# Patient Record
Sex: Female | Born: 1975 | Race: White | Hispanic: No | Marital: Single | Smoking: Current every day smoker
Health system: Southern US, Community
[De-identification: ages and names within clinical notes are randomized; demographics above are authoritative.]

---

## 2000-09-03 ENCOUNTER — Emergency Department (HOSPITAL_COMMUNITY): Admission: EM | Admit: 2000-09-03 | Discharge: 2000-09-03 | Payer: Self-pay | Admitting: Emergency Medicine

## 2006-09-24 ENCOUNTER — Emergency Department (HOSPITAL_COMMUNITY): Admission: EM | Admit: 2006-09-24 | Discharge: 2006-09-24 | Payer: Self-pay | Admitting: *Deleted

## 2007-12-27 ENCOUNTER — Other Ambulatory Visit: Payer: Self-pay | Admitting: Emergency Medicine

## 2007-12-28 ENCOUNTER — Ambulatory Visit: Payer: Self-pay | Admitting: Physician Assistant

## 2007-12-28 ENCOUNTER — Inpatient Hospital Stay (HOSPITAL_COMMUNITY): Admission: AD | Admit: 2007-12-28 | Discharge: 2007-12-28 | Payer: Self-pay | Admitting: Obstetrics and Gynecology

## 2011-05-10 LAB — DIFFERENTIAL
Basophils Absolute: 0
Basophils Relative: 0
Eosinophils Absolute: 0.2
Monocytes Relative: 8
Neutrophils Relative %: 73

## 2011-05-10 LAB — CBC
MCHC: 33.4
MCV: 88.9
RBC: 3.99
RDW: 14.1

## 2011-05-10 LAB — BASIC METABOLIC PANEL
BUN: 6
CO2: 22
Chloride: 107
Creatinine, Ser: 0.61
GFR calc Af Amer: >60
Glucose, Bld: 113 — ABNORMAL HIGH

## 2011-05-10 LAB — URINALYSIS, ROUTINE W REFLEX MICROSCOPIC
Bilirubin Urine: NEGATIVE
Hgb urine dipstick: NEGATIVE
Ketones, ur: NEGATIVE
Nitrite: NEGATIVE
Protein, ur: NEGATIVE
Urobilinogen, UA: 0.2

## 2011-10-19 ENCOUNTER — Encounter (HOSPITAL_COMMUNITY): Payer: Self-pay | Admitting: Emergency Medicine

## 2011-10-19 ENCOUNTER — Emergency Department (HOSPITAL_COMMUNITY)
Admission: EM | Admit: 2011-10-19 | Discharge: 2011-10-20 | Disposition: A | Discharge status: Home Health | Payer: Self-pay | Attending: Emergency Medicine | Admitting: Emergency Medicine

## 2011-10-19 ENCOUNTER — Emergency Department (HOSPITAL_COMMUNITY): Payer: Self-pay

## 2011-10-19 DIAGNOSIS — M25473 Effusion, unspecified ankle: Secondary | ICD-10-CM | POA: Insufficient documentation

## 2011-10-19 DIAGNOSIS — S93409A Sprain of unspecified ligament of unspecified ankle, initial encounter: Secondary | ICD-10-CM | POA: Insufficient documentation

## 2011-10-19 DIAGNOSIS — IMO0002 Reserved for concepts with insufficient information to code with codable children: Secondary | ICD-10-CM | POA: Insufficient documentation

## 2011-10-19 DIAGNOSIS — M25469 Effusion, unspecified knee: Secondary | ICD-10-CM | POA: Insufficient documentation

## 2011-10-19 DIAGNOSIS — S80211A Abrasion, right knee, initial encounter: Secondary | ICD-10-CM

## 2011-10-19 DIAGNOSIS — M25476 Effusion, unspecified foot: Secondary | ICD-10-CM | POA: Insufficient documentation

## 2011-10-19 DIAGNOSIS — S93402A Sprain of unspecified ligament of left ankle, initial encounter: Secondary | ICD-10-CM

## 2011-10-19 DIAGNOSIS — W108XXA Fall (on) (from) other stairs and steps, initial encounter: Secondary | ICD-10-CM | POA: Insufficient documentation

## 2011-10-19 DIAGNOSIS — M25579 Pain in unspecified ankle and joints of unspecified foot: Secondary | ICD-10-CM | POA: Insufficient documentation

## 2011-10-19 DIAGNOSIS — M25569 Pain in unspecified knee: Secondary | ICD-10-CM | POA: Insufficient documentation

## 2011-10-19 NOTE — ED Notes (Signed)
PT. FELL FROM A PORCH THIS EVENING , PRESENTS WITH PAIN AT RIGHT KNEE AND LEFT ANKLE WORSE WITH MOVEMENT.

## 2011-10-20 MED ORDER — HYDROCODONE-ACETAMINOPHEN 5-325 MG PO TABS
1.0000 | ORAL_TABLET | Freq: Once | ORAL | Status: AC
  Administered 2011-10-20: 1 via ORAL
  Filled 2011-10-20: qty 1

## 2011-10-20 MED ORDER — HYDROCODONE-ACETAMINOPHEN 5-325 MG PO TABS: 1.0000 | ORAL_TABLET | ORAL | Status: AC | PRN

## 2011-10-20 MED ORDER — IBUPROFEN 800 MG PO TABS
800.0000 mg | ORAL_TABLET | Freq: Once | ORAL | Status: AC
  Administered 2011-10-20: 800 mg via ORAL
  Filled 2011-10-20: qty 1

## 2011-10-20 MED ORDER — IBUPROFEN 400 MG PO TABS: 400.0000 mg | ORAL_TABLET | Freq: Four times a day (QID) | ORAL | Status: AC | PRN

## 2011-10-20 NOTE — Discharge Instructions (Signed)
Please review the instructions below. The x-ray of your right knee shows no acute injury. Findings on your left ankle x-ray are consistent with a sprain of your left ankle. Wear the ASO splint and crutches until your discomfort improves. Read over the RICE instructions. Use the ibuprofen 3 times a day for 3 days as instructed and the narcotic pain medicine for more severe pain. If your pain does not begin to resolve over the next 3-5 days we have provided a referral to an orthopedic physician with whom he can arrange an follow up for further evaluation.   Abrasions Abrasions are skin scrapes. Their treatment depends on how large and deep the abrasion is. Abrasions do not extend through all layers of the skin. A cut or lesion through all skin layers is called a laceration. HOME CARE INSTRUCTIONS   If you were given a dressing, change it at least once a day or as instructed by your caregiver. If the bandage sticks, soak it off with a solution of water or hydrogen peroxide.   Twice a day, wash the area with soap and water to remove all the cream/ointment. You may do this in a sink, under a tub faucet, or in a shower. Rinse off the soap and pat dry with a clean towel. Look for signs of infection (see below).   Reapply cream/ointment according to your caregiver's instruction. This will help prevent infection and keep the bandage from sticking. Telfa or gauze over the wound and under the dressing or wrap will also help keep the bandage from sticking.   If the bandage becomes wet, dirty, or develops a foul smell, change it as soon as possible.   Only take over-the-counter or prescription medicines for pain, discomfort, or fever as directed by your caregiver.  SEEK IMMEDIATE MEDICAL CARE IF:   Increasing pain in the wound.   Signs of infection develop: redness, swelling, surrounding area is tender to touch, or pus coming from the wound.   You have a fever.   Any foul smell coming from the wound or  dressing.  Most skin wounds heal within ten days. Facial wounds heal faster. However, an infection may occur despite proper treatment. You should have the wound checked for signs of infection within 24 to 48 hours or sooner if problems arise. If you were not given a wound-check appointment, look closely at the wound yourself on the second day for early signs of infection listed above. MAKE SURE YOU:   Understand these instructions.   Will watch your condition.   Will get help right away if you are not doing well or get worse.  Document Released: 05/10/2005 Document Revised: 07/20/2011 Document Reviewed: 07/04/2011 [facility] Patient Information 2012 [place], [place].Ankle Sprain An ankle sprain is an injury to the strong, fibrous tissues (ligaments) that hold the bones of your ankle joint together.  CAUSES Ankle sprain usually is caused by a fall or by twisting your ankle. People who participate in sports are more prone to these types of injuries.  SYMPTOMS  Symptoms of ankle sprain include:  Pain in your ankle. The pain may be present at rest or only when you are trying to stand or walk.   Swelling.   Bruising. Bruising may develop immediately or within 1 to 2 days after your injury.   Difficulty standing or walking.  DIAGNOSIS  Your caregiver will ask you details about your injury and perform a physical exam of your ankle to determine if you have an ankle sprain.  During the physical exam, your caregiver will press and squeeze specific areas of your foot and ankle. Your caregiver will try to move your ankle in certain ways. An X-ray exam may be done to be sure a bone was not broken or a ligament did not separate from one of the bones in your ankle (avulsion).  TREATMENT  Certain types of braces can help stabilize your ankle. Your caregiver can make a recommendation for this. Your caregiver may recommend the use of medication for pain. If your sprain is severe, your caregiver may refer you to  a surgeon who helps to restore function to parts of your skeletal system (orthopedist) or a physical therapist. HOME CARE INSTRUCTIONS  Apply ice to your injury for 1 to 2 days or as directed by your caregiver. Applying ice helps to reduce inflammation and pain.  Put ice in a plastic bag.   Place a towel between your skin and the bag.   Leave the ice on for 15 to 20 minutes at a time, every 2 hours while you are awake.   Take over-the-counter or prescription medicines for pain, discomfort, or fever only as directed by your caregiver.   Keep your injured leg elevated, when possible, to lessen swelling.   If your caregiver recommends crutches, use them as instructed. Gradually, put weight on the affected ankle. Continue to use crutches or a cane until you can walk without feeling pain in your ankle.   If you have a plaster splint, wear the splint as directed by your caregiver. Do not rest it on anything harder than a pillow the first 24 hours. Do not put weight on it. Do not get it wet. You may take it off to take a shower or bath.   You may have been given an elastic bandage to wear around your ankle to provide support. If the elastic bandage is too tight (you have numbness or tingling in your foot or your foot becomes cold and blue), adjust the bandage to make it comfortable.   If you have an air splint, you may blow more air into it or let air out to make it more comfortable. You may take your splint off at night and before taking a shower or bath.   Wiggle your toes in the splint several times per day if you are able.  SEEK MEDICAL CARE IF:   You have an increase in bruising, swelling, or pain.   Your toes feel cold.   Pain relief is not achieved with medication.  SEEK IMMEDIATE MEDICAL CARE IF: Your toes are numb or blue or you have severe pain. MAKE SURE YOU:   Understand these instructions.   Will watch your condition.   Will get help right away if you are not doing well or  get worse.  Document Released: 07/31/2005 Document Revised: 07/20/2011 Document Reviewed: 03/04/2008 [facility] Patient Information 2012 [place], [place].Ankle Sprain An ankle sprain is an injury to the strong, fibrous tissues (ligaments) that hold the bones of your ankle joint together.  CAUSES Ankle sprain usually is caused by a fall or by twisting your ankle. People who participate in sports are more prone to these types of injuries.  SYMPTOMS  Symptoms of ankle sprain include:  Pain in your ankle. The pain may be present at rest or only when you are trying to stand or walk.   Swelling.   Bruising. Bruising may develop immediately or within 1 to 2 days after your injury.   Difficulty  standing or walking.  DIAGNOSIS  Your caregiver will ask you details about your injury and perform a physical exam of your ankle to determine if you have an ankle sprain. During the physical exam, your caregiver will press and squeeze specific areas of your foot and ankle. Your caregiver will try to move your ankle in certain ways. An X-ray exam may be done to be sure a bone was not broken or a ligament did not separate from one of the bones in your ankle (avulsion).  TREATMENT  Certain types of braces can help stabilize your ankle. Your caregiver can make a recommendation for this. Your caregiver may recommend the use of medication for pain. If your sprain is severe, your caregiver may refer you to a surgeon who helps to restore function to parts of your skeletal system (orthopedist) or a physical therapist. HOME CARE INSTRUCTIONS  Apply ice to your injury for 1 to 2 days or as directed by your caregiver. Applying ice helps to reduce inflammation and pain.  Put ice in a plastic bag.   Place a towel between your skin and the bag.   Leave the ice on for 15 to 20 minutes at a time, every 2 hours while you are awake.   Take over-the-counter or prescription medicines for pain, discomfort, or fever only as  directed by your caregiver.   Keep your injured leg elevated, when possible, to lessen swelling.   If your caregiver recommends crutches, use them as instructed. Gradually, put weight on the affected ankle. Continue to use crutches or a cane until you can walk without feeling pain in your ankle.   If you have a plaster splint, wear the splint as directed by your caregiver. Do not rest it on anything harder than a pillow the first 24 hours. Do not put weight on it. Do not get it wet. You may take it off to take a shower or bath.   You may have been given an elastic bandage to wear around your ankle to provide support. If the elastic bandage is too tight (you have numbness or tingling in your foot or your foot becomes cold and blue), adjust the bandage to make it comfortable.   If you have an air splint, you may blow more air into it or let air out to make it more comfortable. You may take your splint off at night and before taking a shower or bath.   Wiggle your toes in the splint several times per day if you are able.  SEEK MEDICAL CARE IF:   You have an increase in bruising, swelling, or pain.   Your toes feel cold.   Pain relief is not achieved with medication.  SEEK IMMEDIATE MEDICAL CARE IF: Your toes are numb or blue or you have severe pain. MAKE SURE YOU:   Understand these instructions.   Will watch your condition.   Will get help right away if you are not doing well or get worse.  Document Released: 07/31/2005 Document Revised: 07/20/2011 Document Reviewed: 03/04/2008 [facility] Patient Information 2012 [place], [place].

## 2011-10-20 NOTE — ED Provider Notes (Signed)
History     CSN: [id number]  Arrival date & time 10/19/11  2256   First MD Initiated Contact with Patient 10/20/11 0113      Chief Complaint  Patient presents with  . Fall    HPI: Patient is a 36 y.o. female presenting with fall. The history is provided by the patient.  Fall The accident occurred 6 to 12 hours ago. The fall occurred while walking. She fell from a height of 3 to 5 ft. There was no blood loss. The pain is at a severity of 9/10. The pain is severe. She was ambulatory at the scene. There was no entrapment after the fall. There was no drug use involved in the accident. There was no alcohol use involved in the accident. Pertinent negatives include no numbness and no loss of consciousness. The symptoms are aggravated by standing. The treatment provided no relief.   patient reports that approximately 6:30 PM this evening she was stepping off her with a low step when she tripped and fell. Has had increasing pain and swelling to her left ankle and pain to her right knee since the time of the fall. Denies other injuries.  Past Medical History  Diagnosis Date  . Asthma     Past Surgical History  Procedure Date  . Cesarean section     No family history on file.  History  Substance Use Topics  . Smoking status: Current Everyday Smoker  . Smokeless tobacco: Not on file  . Alcohol Use: No    OB History    Grav Para Term Preterm Abortions TAB SAB Ect Mult Living                  Review of Systems  Constitutional: Negative.   HENT: Negative.   Eyes: Negative.   Respiratory: Negative.   Cardiovascular: Negative.   Gastrointestinal: Negative.   Genitourinary: Negative.   Musculoskeletal: Negative.   Skin: Negative.   Neurological: Negative.  Negative for loss of consciousness and numbness.  Hematological: Negative.   Psychiatric/Behavioral: Negative.     Allergies  Review of patient's allergies indicates no known allergies.  Home Medications  No current  outpatient prescriptions on file.  BP 111/67  Pulse 94  Temp(Src) 98.6 F (37 C) (Oral)  Resp 18  SpO2 97%  LMP 10/02/2011  Physical Exam  Constitutional: She is oriented to person, place, and time. She appears well-developed and well-nourished.  HENT:  Head: Normocephalic and atraumatic.  Eyes: Conjunctivae are normal.  Cardiovascular: Normal rate.   Pulmonary/Chest: Effort normal.  Abdominal: Soft. Bowel sounds are normal.  Musculoskeletal: Normal range of motion.       Feet:       Moderate swelling to (L) lateral ankle. Small abrasion to (R) knee.  Neurological: She is alert and oriented to person, place, and time.  Skin: Skin is warm and dry. No erythema.  Psychiatric: She has a normal mood and affect.    ED Course  Procedures   Findings and clinical impression discussed with patient. Will place ASO splint and fit for crutches and encourage f/u w/ ortho if pain persist.  Labs Reviewed - No data to display Dg Ankle Complete Left  10/19/2011  *RADIOLOGY REPORT*  Clinical Data: Left ankle pain and swelling, past history of fractures  LEFT ANKLE COMPLETE - 3+ VIEW  Comparison: None  Findings: Soft tissue swelling greatest laterally. Osseous mineralization normal. Ankle mortise intact. Question small plantar calcaneal spur. Obliquity on lateral view. No acute  fracture, dislocation, or bone destruction.  IMPRESSION: No acute osseous abnormalities.  Original Report Authenticated By: Lollie Marrow, M.D.   Dg Knee Complete 4 Views Right  10/19/2011  *RADIOLOGY REPORT*  Clinical Data: Pain and swelling post fall down stairs  RIGHT KNEE - COMPLETE 4+ VIEW  Comparison: None.  Findings: Mild joint space narrowing. Low normal osseous mineralization. No acute fracture, dislocation, or bone destruction. No knee joint effusion.  IMPRESSION: Minimal degenerative changes. No acute abnormalities.  Original Report Authenticated By: Lollie Marrow, M.D.     No diagnosis found.    MDM  HPI/PE  and clinical findings c/e 1. Ankle sprain (L)  2. Abrasion (R) knee        Leanne Chang, NP 10/20/11 0153  Roma Kayser Amorita Vanrossum, NP 10/20/11 1610

## 2011-10-20 NOTE — ED Provider Notes (Signed)
Medical screening examination/treatment/procedure(s) were performed by non-physician practitioner and as supervising physician I was immediately available for consultation/collaboration.  Flint Melter, MD 10/20/11 [phone]

## 2013-07-05 IMAGING — CR DG ANKLE COMPLETE 3+V*L*
3 series · 3 of 3 positions shown · non-contrast
Comparison: None

CLINICAL DATA: Left ankle pain and swelling, past history of
fractures

LEFT ANKLE COMPLETE - 3+ VIEW

[t ankle joint ap left]
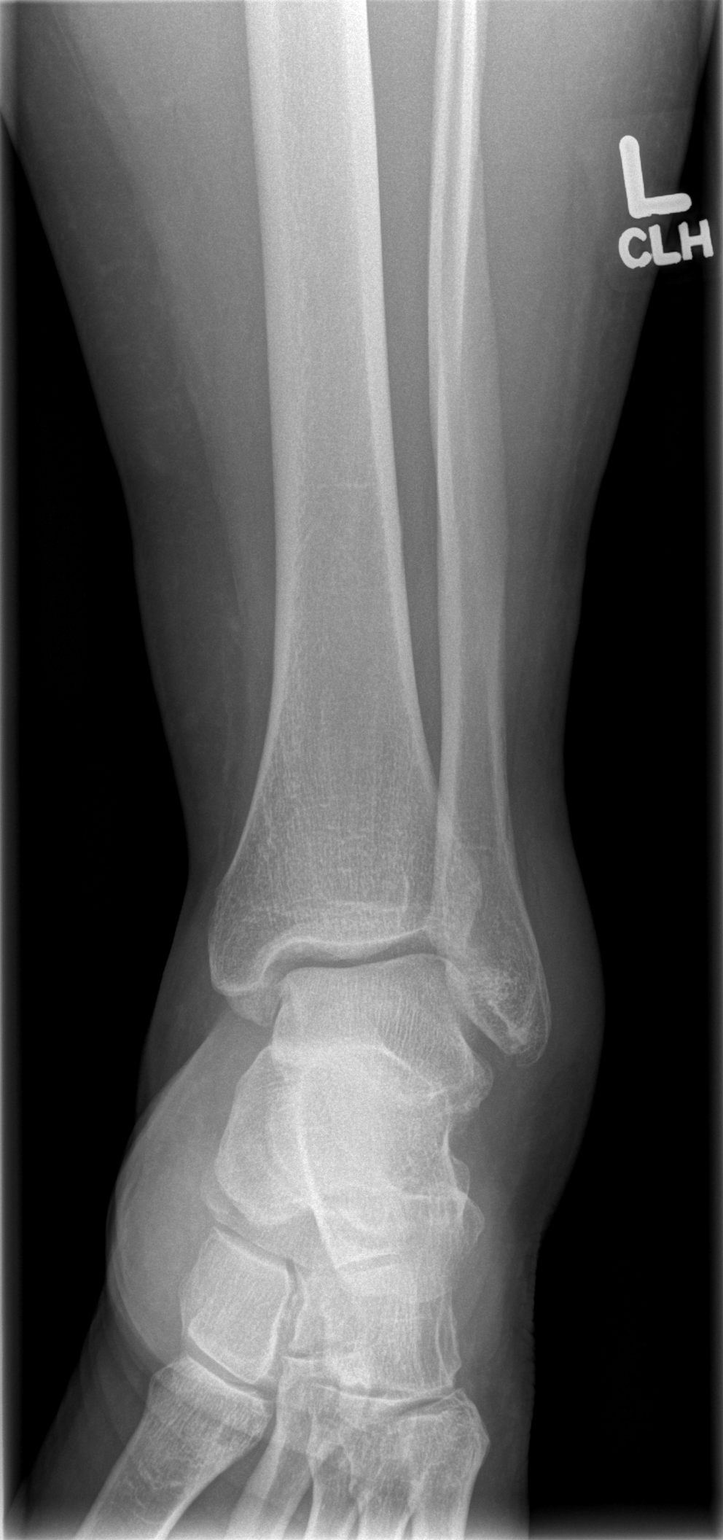

[t ankle joint oblique left]
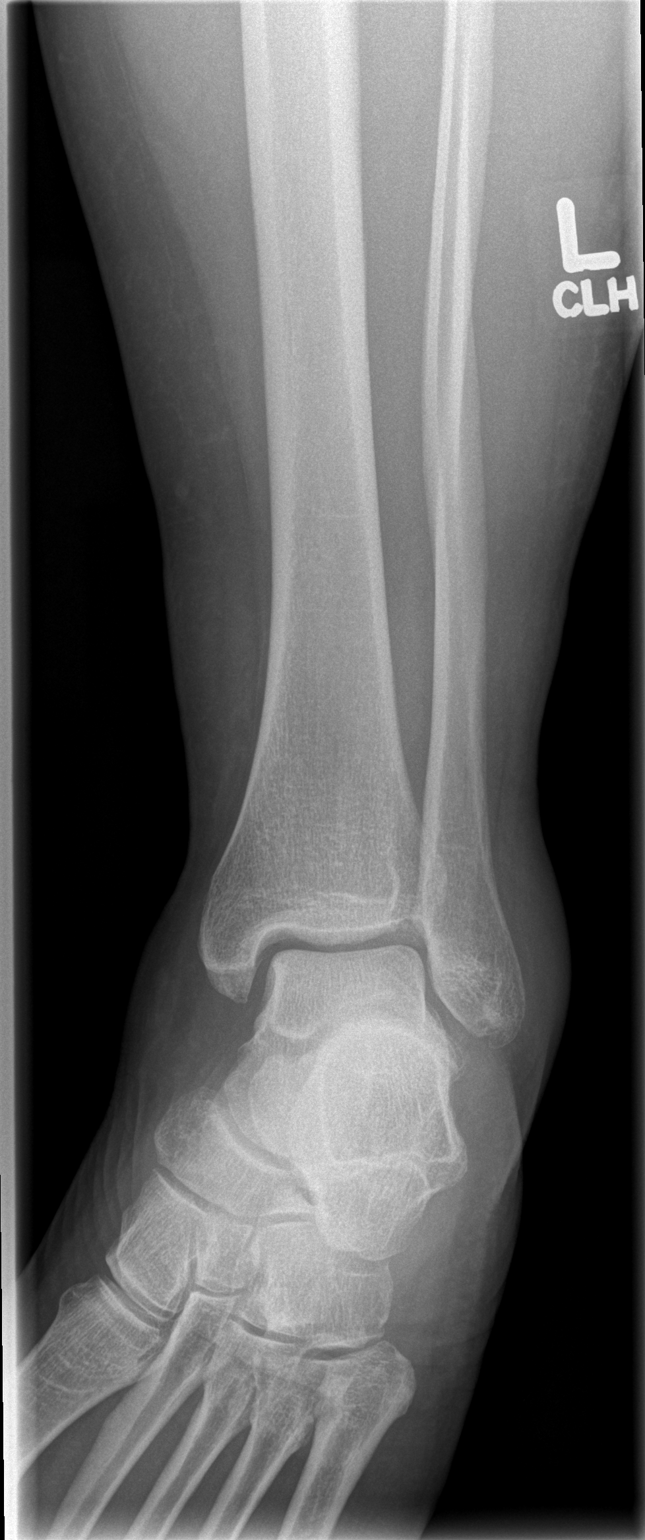

[t ankle joint lat left]
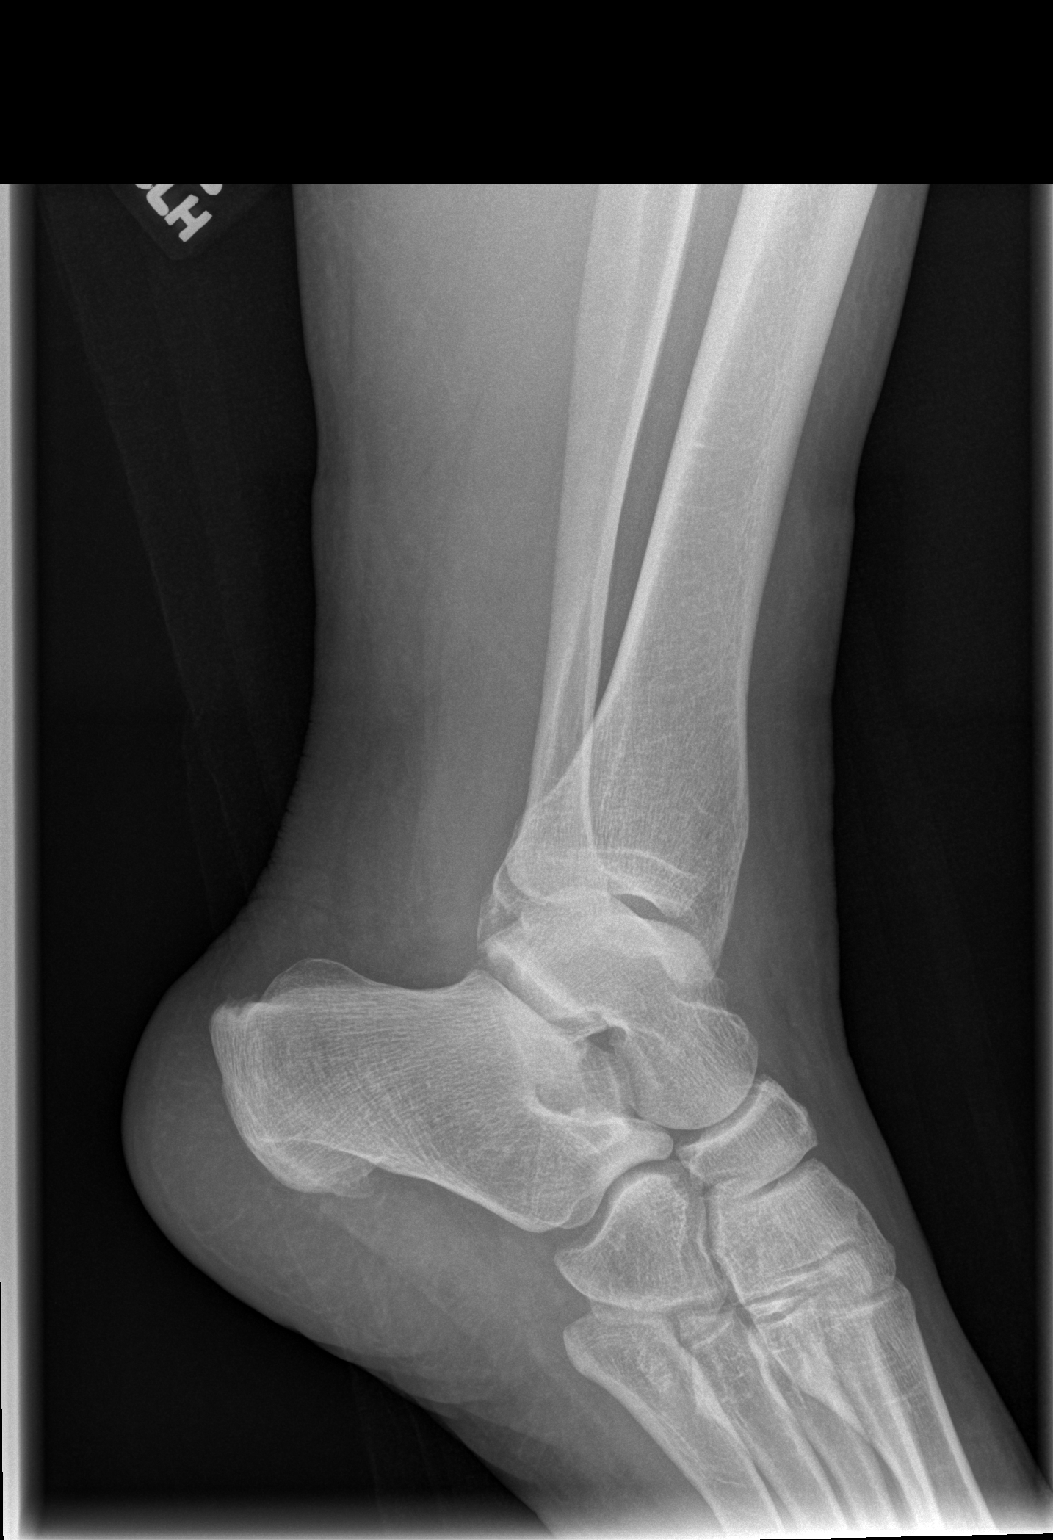

[3 of 3 positions shown; findings below may reference images not displayed]

FINDINGS: Soft tissue swelling greatest laterally.
Osseous mineralization normal.
Ankle mortise intact.
Question small plantar calcaneal spur.
Obliquity on lateral view.
No acute fracture, dislocation, or bone destruction.
IMPRESSION: No acute osseous abnormalities.

## 2013-07-05 IMAGING — CR DG KNEE COMPLETE 4+V*R*
4 series · 4 of 4 positions shown · non-contrast
Comparison: None.

CLINICAL DATA: Pain and swelling post fall down stairs

RIGHT KNEE - COMPLETE 4+ VIEW

[t knee ap right]
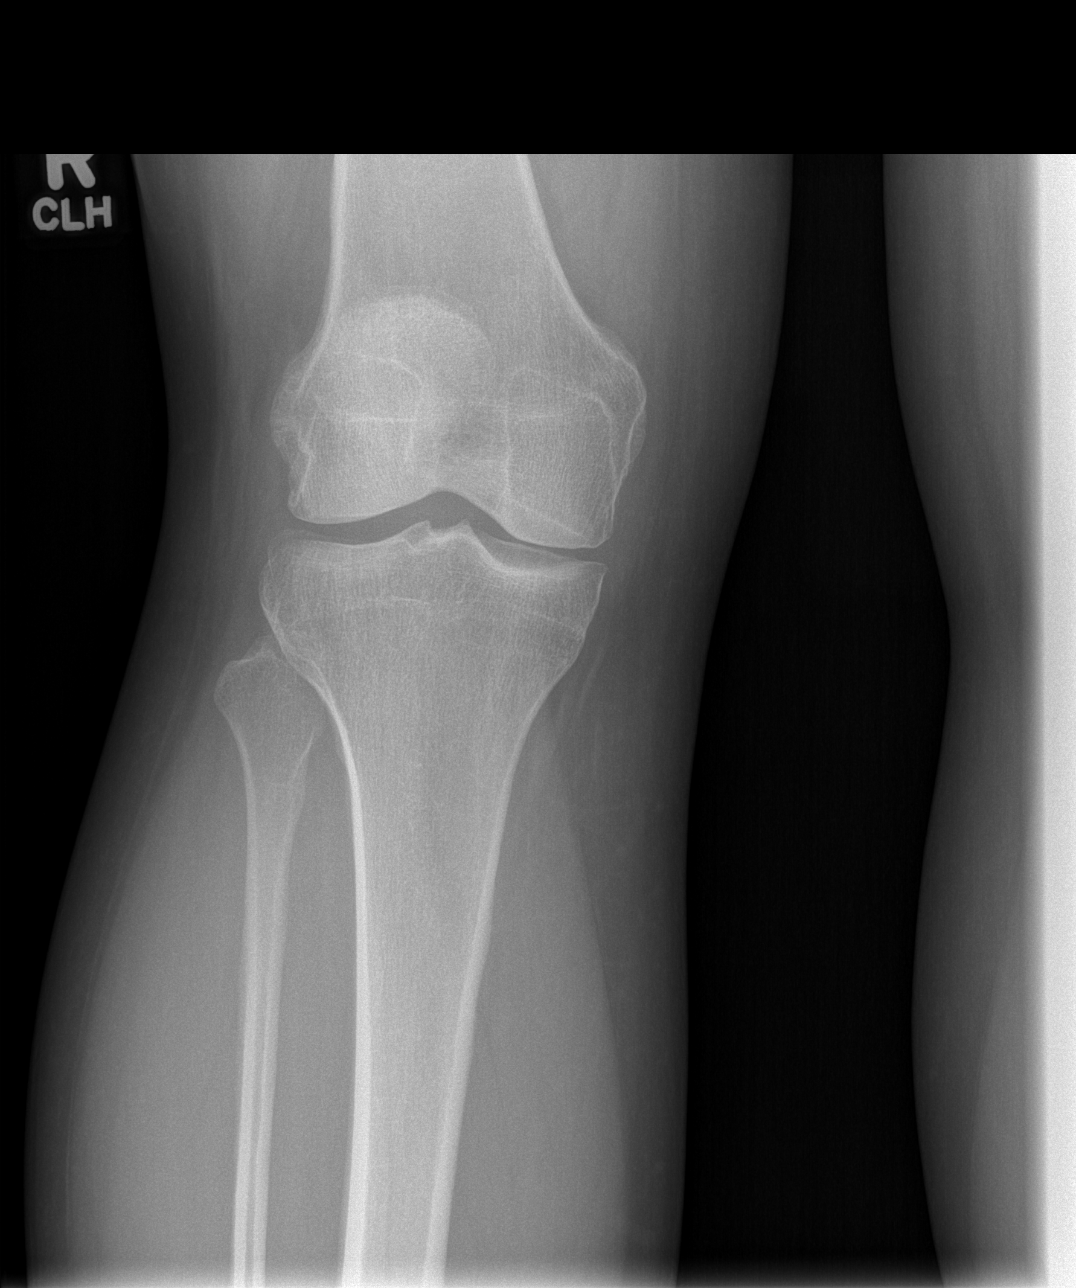

[t knee oblique right (1 of 2)]
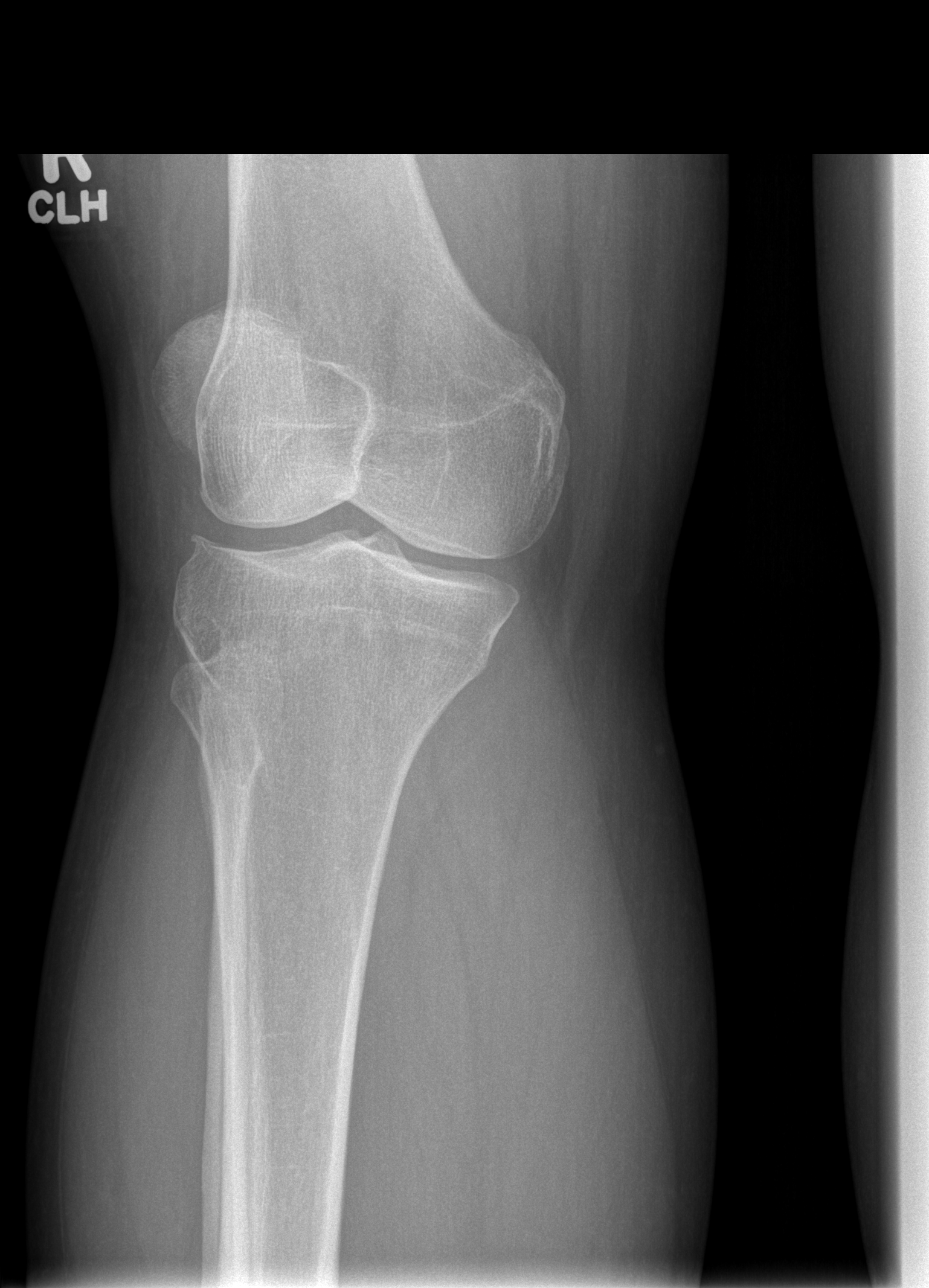

[t knee oblique right (2 of 2)]
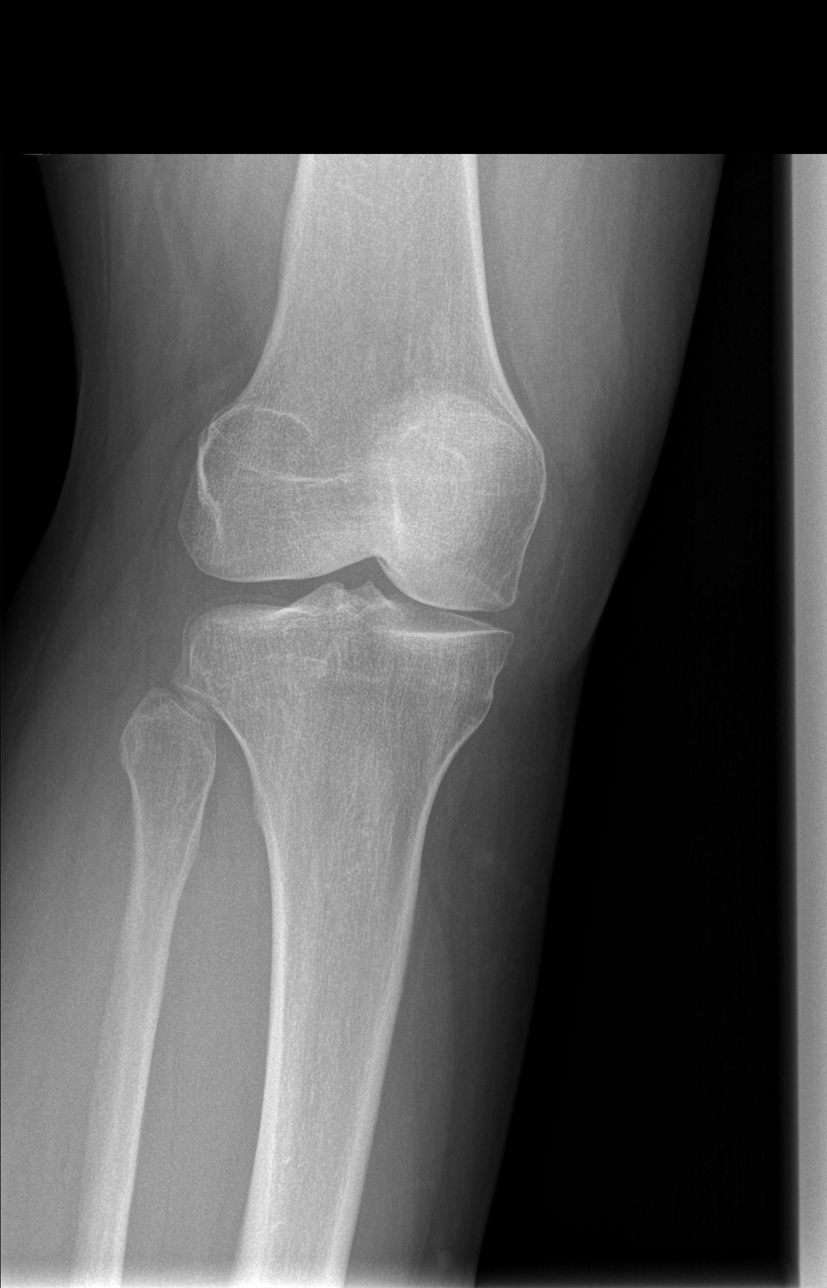

[t knee lat right]
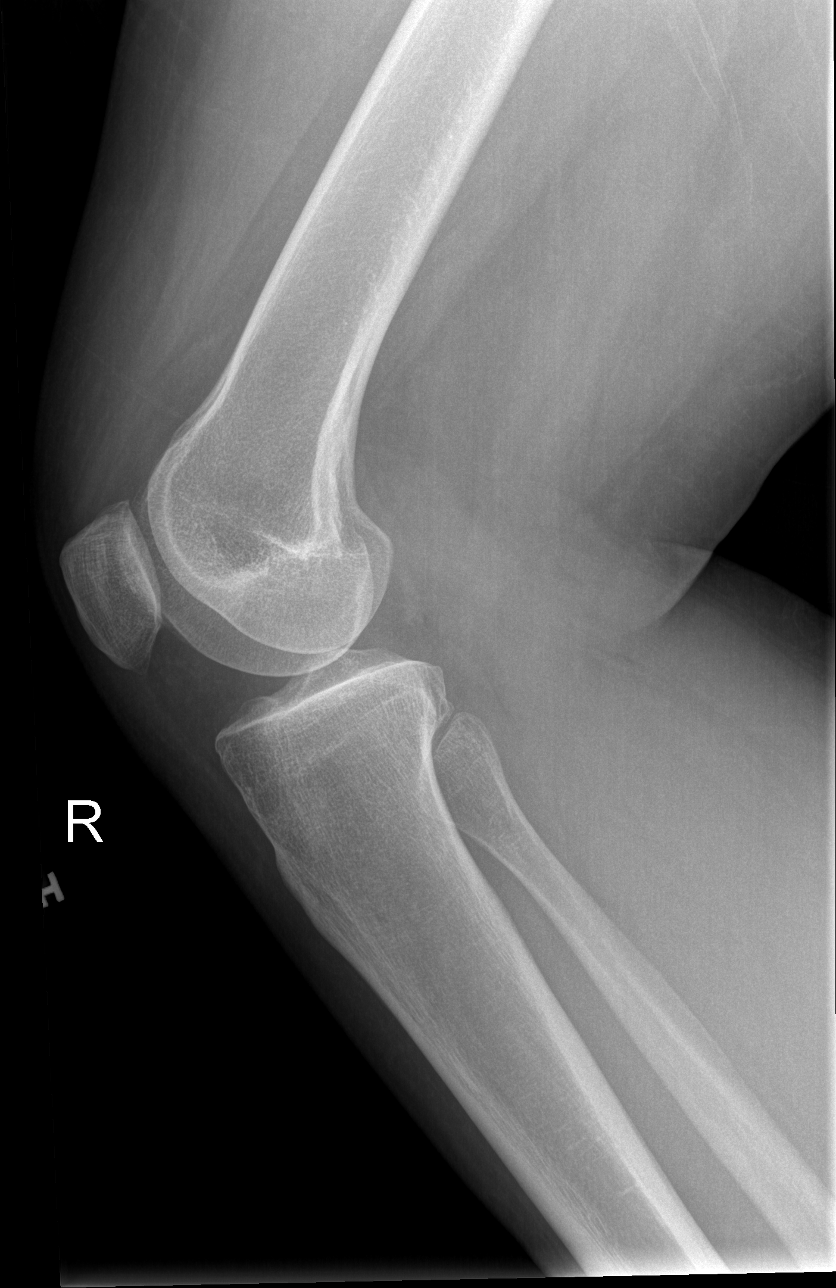

[4 of 4 positions shown; findings below may reference images not displayed]

FINDINGS: Mild joint space narrowing.
Low normal osseous mineralization.
No acute fracture, dislocation, or bone destruction.
No knee joint effusion.
IMPRESSION: Minimal degenerative changes.
No acute abnormalities.

## 2015-10-03 ENCOUNTER — Encounter (HOSPITAL_COMMUNITY): Payer: Self-pay | Admitting: *Deleted

## 2015-10-03 ENCOUNTER — Inpatient Hospital Stay (HOSPITAL_COMMUNITY)
Admission: AD | Admit: 2015-10-03 | Discharge: 2015-10-03 | Disposition: A | Discharge status: Home / Self Care | Payer: Self-pay | Source: Ambulatory Visit | Attending: Obstetrics and Gynecology | Admitting: Obstetrics and Gynecology

## 2015-10-03 DIAGNOSIS — Z3201 Encounter for pregnancy test, result positive: Secondary | ICD-10-CM | POA: Insufficient documentation

## 2015-10-03 DIAGNOSIS — J45909 Unspecified asthma, uncomplicated: Secondary | ICD-10-CM | POA: Insufficient documentation

## 2015-10-03 DIAGNOSIS — O0281 Inappropriate change in quantitative human chorionic gonadotropin (hCG) in early pregnancy: Secondary | ICD-10-CM | POA: Insufficient documentation

## 2015-10-03 DIAGNOSIS — Z3A01 Less than 8 weeks gestation of pregnancy: Secondary | ICD-10-CM | POA: Insufficient documentation

## 2015-10-03 DIAGNOSIS — O3680X0 Pregnancy with inconclusive fetal viability, not applicable or unspecified: Secondary | ICD-10-CM | POA: Insufficient documentation

## 2015-10-03 DIAGNOSIS — F17200 Nicotine dependence, unspecified, uncomplicated: Secondary | ICD-10-CM | POA: Insufficient documentation

## 2015-10-03 LAB — URINALYSIS, ROUTINE W REFLEX MICROSCOPIC
BILIRUBIN URINE: NEGATIVE
Glucose, UA: NEGATIVE mg/dL
HGB URINE DIPSTICK: NEGATIVE
Ketones, ur: NEGATIVE mg/dL
Leukocytes, UA: NEGATIVE
NITRITE: NEGATIVE
PH: 5.5 (ref 5.0–8.0)
Protein, ur: NEGATIVE mg/dL

## 2015-10-03 LAB — POCT PREGNANCY, URINE: PREG TEST UR: POSITIVE — AB

## 2015-10-03 LAB — HCG, QUANTITATIVE, PREGNANCY: hCG, Beta Chain, Quant, S: 205 m[IU]/mL — ABNORMAL HIGH (ref <5)

## 2015-10-03 NOTE — Discharge Instructions (Signed)

## 2015-10-03 NOTE — MAU Note (Signed)
Pt reports she is 6 weeks preg and was seen at [facility] ED last week due to cramping , BHCG was 337. Had some bleeding also at that time and was told it was a threatened miscarriage and was to follow up there today. Has not had spotting since Friday and her abd is sore but not painful.

## 2015-10-03 NOTE — MAU Provider Note (Signed)
History     CSN: [id number]  Arrival date and time: 10/03/15 9147   First Provider Initiated Contact with Patient 10/03/15 2006      Chief Complaint  Patient presents with  . Follow-up   HPI Maria Campos 40 y.o. G3P2000 @ approximately 6 weeks presents to the MAU for repeat Beta HCG. She states that she was seen in the [facility] ER on Thursday because she was having some bleeding and cramping. She was told that her Beta HCG level was 337 and did not have an ultrasound at that time. She was told she might be having a miscarriage and was to come back in 2 days for follow up blood work. She is currently asymptomatic. Denies vaginal bleeding and cramping.   Past Medical History  Diagnosis Date  . Asthma     Past Surgical History  Procedure Laterality Date  . Cesarean section      History reviewed. No pertinent family history.  Social History  Substance Use Topics  . Smoking status: Current Every Day Smoker  . Smokeless tobacco: None  . Alcohol Use: No    Allergies: No Known Allergies  Prescriptions prior to admission  Medication Sig Dispense Refill Last Dose  . diphenhydramine-acetaminophen (TYLENOL PM) 25-500 MG TABS tablet Take 2 tablets by mouth daily as needed (pain).   10/02/2015 at Unknown time    Review of Systems  Constitutional: Negative for fever.  Gastrointestinal: Negative for nausea, vomiting and abdominal pain.  All other systems reviewed and are negative.  Physical Exam   Blood pressure 126/74, pulse 102, temperature 98.9 F (37.2 C), resp. rate 16, height  (1.702 m), weight 133.358 kg (294 lb), last menstrual period 07/27/2015, SpO2 98 %.  Physical Exam  Nursing note and vitals reviewed. Constitutional: She is oriented to person, place, and time. She appears well-developed and well-nourished. No distress.  HENT:  Head: Normocephalic and atraumatic.  Neck: Normal range of motion.  Cardiovascular: Normal rate.   Respiratory: Effort normal.  No respiratory distress.  GI: Soft. She exhibits no distension and no mass. There is no tenderness. There is no rebound and no guarding.  Musculoskeletal: Normal range of motion. She exhibits no edema or tenderness.  Neurological: She is alert and oriented to person, place, and time.  Skin: Skin is warm.  Psychiatric: She has a normal mood and affect. Her behavior is normal. Judgment and thought content normal.   Results for orders placed or performed during the hospital encounter of 10/03/15 (from the past 24 hour(s))  Urinalysis, Routine w reflex microscopic (not at [facility])     Status: Abnormal   Collection Time: 10/03/15  7:40 PM  Result Value Ref Range   Color, Urine YELLOW YELLOW   APPearance CLEAR CLEAR   Specific Gravity, Urine >1.030 (H) 1.005 - 1.030   pH 5.5 5.0 - 8.0   Glucose, UA NEGATIVE NEGATIVE mg/dL   Hgb urine dipstick NEGATIVE NEGATIVE   Bilirubin Urine NEGATIVE NEGATIVE   Ketones, ur NEGATIVE NEGATIVE mg/dL   Protein, ur NEGATIVE NEGATIVE mg/dL   Nitrite NEGATIVE NEGATIVE   Leukocytes, UA NEGATIVE NEGATIVE  Pregnancy, urine POC     Status: Abnormal   Collection Time: 10/03/15  8:14 PM  Result Value Ref Range   Preg Test, Ur POSITIVE (A) NEGATIVE  hCG, quantitative, pregnancy     Status: Abnormal   Collection Time: 10/03/15  8:16 PM  Result Value Ref Range   hCG, Beta Chain, Quant, S 205 (H) <5 mIU/mL  MAU Course  Procedures  MDM Discussed POC with Dr Emelda Fear. Will obtain beta hcg and follow up appropriately. Turned over care to [name], CNM  Assessment and Plan   1. Inappropriate change in quantitative hCG in early pregnancy   2. Pregnancy of unknown anatomic location    DC home Comfort measures reviewed  1st Trimester precautions  Bleeding precautions Ectopic precautions RX: none  Return to MAU as needed   Follow-up Information    Follow up with [facility]   Specialty:  Obstetrics and Gynecology   Why:  [facility] 10/06/15  AT 11:00    Contact information:   [address] [place] [address] [phone]        Clemmons,Lori Grissett 10/03/2015, 8:16 PM
